# Patient Record
Sex: Male | Born: 1990 | Race: White | Hispanic: No | Marital: Married | Smoking: Never smoker
Health system: Southern US, Community
[De-identification: ages and names within clinical notes are randomized; demographics above are authoritative.]

---
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
## 2015-03-18 ENCOUNTER — Ambulatory Visit
Admission: RE | Admit: 2015-03-18 | Discharge: 2015-03-18 | Disposition: A | Discharge status: Home / Self Care | Payer: No Typology Code available for payment source | Source: Ambulatory Visit | Attending: Occupational Medicine | Admitting: Occupational Medicine

## 2015-03-18 ENCOUNTER — Other Ambulatory Visit: Payer: Self-pay | Admitting: Occupational Medicine

## 2015-03-18 DIAGNOSIS — Z021 Encounter for pre-employment examination: Secondary | ICD-10-CM

## 2016-02-23 ENCOUNTER — Ambulatory Visit
Admission: RE | Admit: 2016-02-23 | Discharge: 2016-02-23 | Disposition: A | Discharge status: Home / Self Care | Payer: Worker's Compensation | Source: Ambulatory Visit | Attending: Nurse Practitioner | Admitting: Nurse Practitioner

## 2016-02-23 ENCOUNTER — Other Ambulatory Visit: Payer: Self-pay | Admitting: Nurse Practitioner

## 2016-02-23 DIAGNOSIS — R52 Pain, unspecified: Secondary | ICD-10-CM

## 2017-07-26 ENCOUNTER — Ambulatory Visit (INDEPENDENT_AMBULATORY_CARE_PROVIDER_SITE_OTHER): Payer: 59

## 2017-07-26 ENCOUNTER — Ambulatory Visit (HOSPITAL_COMMUNITY)
Admission: EM | Admit: 2017-07-26 | Discharge: 2017-07-26 | Disposition: A | Discharge status: Home / Self Care | Payer: 59 | Attending: Family Medicine | Admitting: Family Medicine

## 2017-07-26 ENCOUNTER — Encounter (HOSPITAL_COMMUNITY): Payer: Self-pay | Admitting: Emergency Medicine

## 2017-07-26 DIAGNOSIS — R05 Cough: Secondary | ICD-10-CM

## 2017-07-26 DIAGNOSIS — J019 Acute sinusitis, unspecified: Secondary | ICD-10-CM | POA: Diagnosis not present

## 2017-07-26 DIAGNOSIS — R0602 Shortness of breath: Secondary | ICD-10-CM

## 2017-07-26 DIAGNOSIS — R059 Cough, unspecified: Secondary | ICD-10-CM

## 2017-07-26 MED ORDER — GUAIFENESIN-DM 100-10 MG/5ML PO SYRP: 10.0000 mL | ORAL_SOLUTION | Freq: Four times a day (QID) | ORAL | 0 refills | Status: AC | PRN

## 2017-07-26 MED ORDER — AMOXICILLIN-POT CLAVULANATE 875-125 MG PO TABS: 1.0000 | ORAL_TABLET | Freq: Two times a day (BID) | ORAL | 0 refills | Status: AC

## 2017-07-26 NOTE — ED Provider Notes (Signed)
[facility]    CSN: [id number] Arrival date & time: 07/26/17  1731     History   Chief Complaint Chief Complaint  Patient presents with  . Cough    HPI Thomas Austin is a 27 y.o. male Patient is presenting with URI symptoms- congestion, cough, occasional sore throat. Patient's main complaints are cough. Symptoms have been going on for 2 weeks. Patient has tried Dayquil, with minimal relief. Denies fever, nausea, vomiting, diarrhea. Denies shortness of breath and chest pain. Denies history of asthma, COPD, smoking.    HPI  History reviewed. No pertinent past medical history.  There are no active problems to display for this patient.   History reviewed. No pertinent surgical history.     Home Medications    Prior to Admission medications   Medication Sig Start Date End Date Taking? Authorizing Provider  amoxicillin-clavulanate (AUGMENTIN) 875-125 MG tablet Take 1 tablet by mouth every 12 (twelve) hours for 7 days. 07/26/17 08/02/17  Taria Castrillo C, PA-C  guaiFENesin-dextromethorphan (ROBITUSSIN DM) 100-10 MG/5ML syrup Take 10 mLs by mouth every 6 (six) hours as needed for up to 5 days for cough. 07/26/17 07/31/17  Yaziel Brandon, Junius CreamerHallie C, PA-C    Family History No family history on file.  Social History Social History   Tobacco Use  . Smoking status: Never Smoker  . Smokeless tobacco: Never Used  Substance Use Topics  . Alcohol use: Yes    Comment: rarely  . Drug use: Not on file     Allergies   Patient has no known allergies.   Review of Systems Review of Systems  Constitutional: Negative for activity change, appetite change, fatigue and fever.  HENT: Positive for congestion, postnasal drip and rhinorrhea. Negative for ear pain, sinus pressure and sore throat.   Eyes: Negative for pain and itching.  Respiratory: Positive for cough. Negative for shortness of breath.   Cardiovascular: Negative for chest pain.  Gastrointestinal: Negative for  abdominal pain, diarrhea, nausea and vomiting.  Musculoskeletal: Negative for myalgias.  Skin: Negative for rash.  Neurological: Negative for dizziness, light-headedness and headaches.     Physical Exam Triage Vital Signs ED Triage Vitals  Enc Vitals Group     BP 07/26/17 1758 (!) 157/106     Pulse Rate 07/26/17 1755 98     Resp 07/26/17 1755 16     Temp 07/26/17 1755 99.1 F (37.3 C)     Temp Source 07/26/17 1755 Oral     SpO2 07/26/17 1755 96 %     Weight 07/26/17 1754 280 lb (127 kg)     Height 07/26/17 1754 6\' 2"  (1.88 m)     Head Circumference --      Peak Flow --      Pain Score 07/26/17 1754 4     Pain Loc --      Pain Edu? --      Excl. in GC? --    No data found.  Updated Vital Signs BP (!) 157/106   Pulse 98   Temp 99.1 F (37.3 C) (Oral)   Resp 16   Ht 6\' 2"  (1.88 m)   Wt 280 lb (127 kg)   SpO2 96%   BMI 35.95 kg/m    Physical Exam  Constitutional: He appears well-developed and well-nourished.  HENT:  Head: Normocephalic and atraumatic.  Right Ear: Tympanic membrane and ear canal normal.  Left Ear: Tympanic membrane and ear canal normal.  Nose: Rhinorrhea present.  Mouth/Throat: Uvula is  midline and mucous membranes are normal. No oral lesions. No trismus in the jaw. No uvula swelling. Posterior oropharyngeal erythema present.  Erythematous and swollen turbinates  Eyes: Conjunctivae are normal.  Neck: Neck supple.  Cardiovascular: Normal rate and regular rhythm.  No murmur heard. Pulmonary/Chest: Effort normal and breath sounds normal. No respiratory distress. He has no wheezes. He has no rales.  Clear to auscultation bilaterally no adventitious sounds appreciated  Abdominal: Soft. There is no tenderness.  Musculoskeletal: He exhibits no edema.  Neurological: He is alert.  Skin: Skin is warm and dry.  Psychiatric: He has a normal mood and affect.  Nursing note and vitals reviewed.    UC Treatments / Results  Labs (all labs ordered are  listed, but only abnormal results are displayed) Labs Reviewed - No data to display  EKG  EKG Interpretation None       Radiology Dg Chest 2 View  Result Date: 07/26/2017 CLINICAL DATA:  27 year old male with productive cough for 2 weeks. Shortness of breath and chills. EXAM: CHEST  2 VIEW COMPARISON:  Chest radiographs 06/07/2009. FINDINGS: Mildly lower lung volumes on the PA view, but stable on the lateral view. Mediastinal contours remain normal. Visualized tracheal air column is within normal limits. No pneumothorax, pulmonary edema, pleural effusion or confluent pulmonary opacity. No acute osseous abnormality identified. Negative visible bowel gas pattern. IMPRESSION: Negative.  No acute cardiopulmonary abnormality. Electronically Signed   By: Odessa Fleming M.D.   On: 07/26/2017 18:24    Procedures Procedures (including critical care time)  Medications Ordered in UC Medications - No data to display   Initial Impression / Assessment and Plan / UC Course  I have reviewed the triage vital signs and the nursing notes.  Pertinent labs & imaging results that were available during my care of the patient were reviewed by me and considered in my medical decision making (see chart for details).     Patient presents with symptoms likely from sinusitis versus a post viral cough.  No pneumonia on chest x-ray.  Do not suspect underlying cardiopulmonary process. Symptoms seem unlikely related to ACS, CHF or COPD exacerbations, pneumonia, pneumothorax. Patient is nontoxic appearing and not in need of emergent medical intervention.  We will treat with Augmentin 7 days Robitussin DM for cough advised cough may linger for a couple of weeks. Recommended symptom control with over the counter medications: Daily oral anti-histamine, Oral decongestant or IN corticosteroid, saline irrigations, cepacol lozenges, Robitussin, Delsym, honey tea.  Return if symptoms fail to improve in 1-2 weeks or you develop  shortness of breath, chest pain, severe headache. Patient states understanding and is agreeable.     Final Clinical Impressions(s) / UC Diagnoses   Final diagnoses:  Cough  Acute sinusitis with symptoms > 10 days    ED Discharge Orders        Ordered    amoxicillin-clavulanate (AUGMENTIN) 875-125 MG tablet  Every 12 hours     07/26/17 1831    guaiFENesin-dextromethorphan (ROBITUSSIN DM) 100-10 MG/5ML syrup  Every 6 hours PRN     07/26/17 1831       Controlled Substance Prescriptions [place] Controlled Substance Registry consulted? Not Applicable   Lew Dawes, [place] 07/26/17 2104

## 2017-07-26 NOTE — ED Triage Notes (Signed)
PT reports sinus symptoms 2 weeks ago that turned into a productive cough with cold chills.

## 2017-07-26 NOTE — Discharge Instructions (Signed)
Please take augmentin twice daily for 7 days. We are treating you for a sinus infection.   Please also take daily zyrtec or claritin (store brand okay) consistently for 2 week. May also use flonase. These are found over the counter.  I have sent in Robitussin DM for cough, use as needed. May also try honey tea for cough.  Please return if symptoms persisting without improvement in 1-2 weeks.

## 2018-10-21 IMAGING — DX DG CHEST 2V
2 series · 2 of 2 positions shown · non-contrast
Comparison: Chest radiographs 06/07/2009.

CLINICAL DATA: 26-year-old male with productive cough for 2 weeks.
Shortness of breath and chills.

EXAM:
CHEST  2 VIEW

[chest pa]
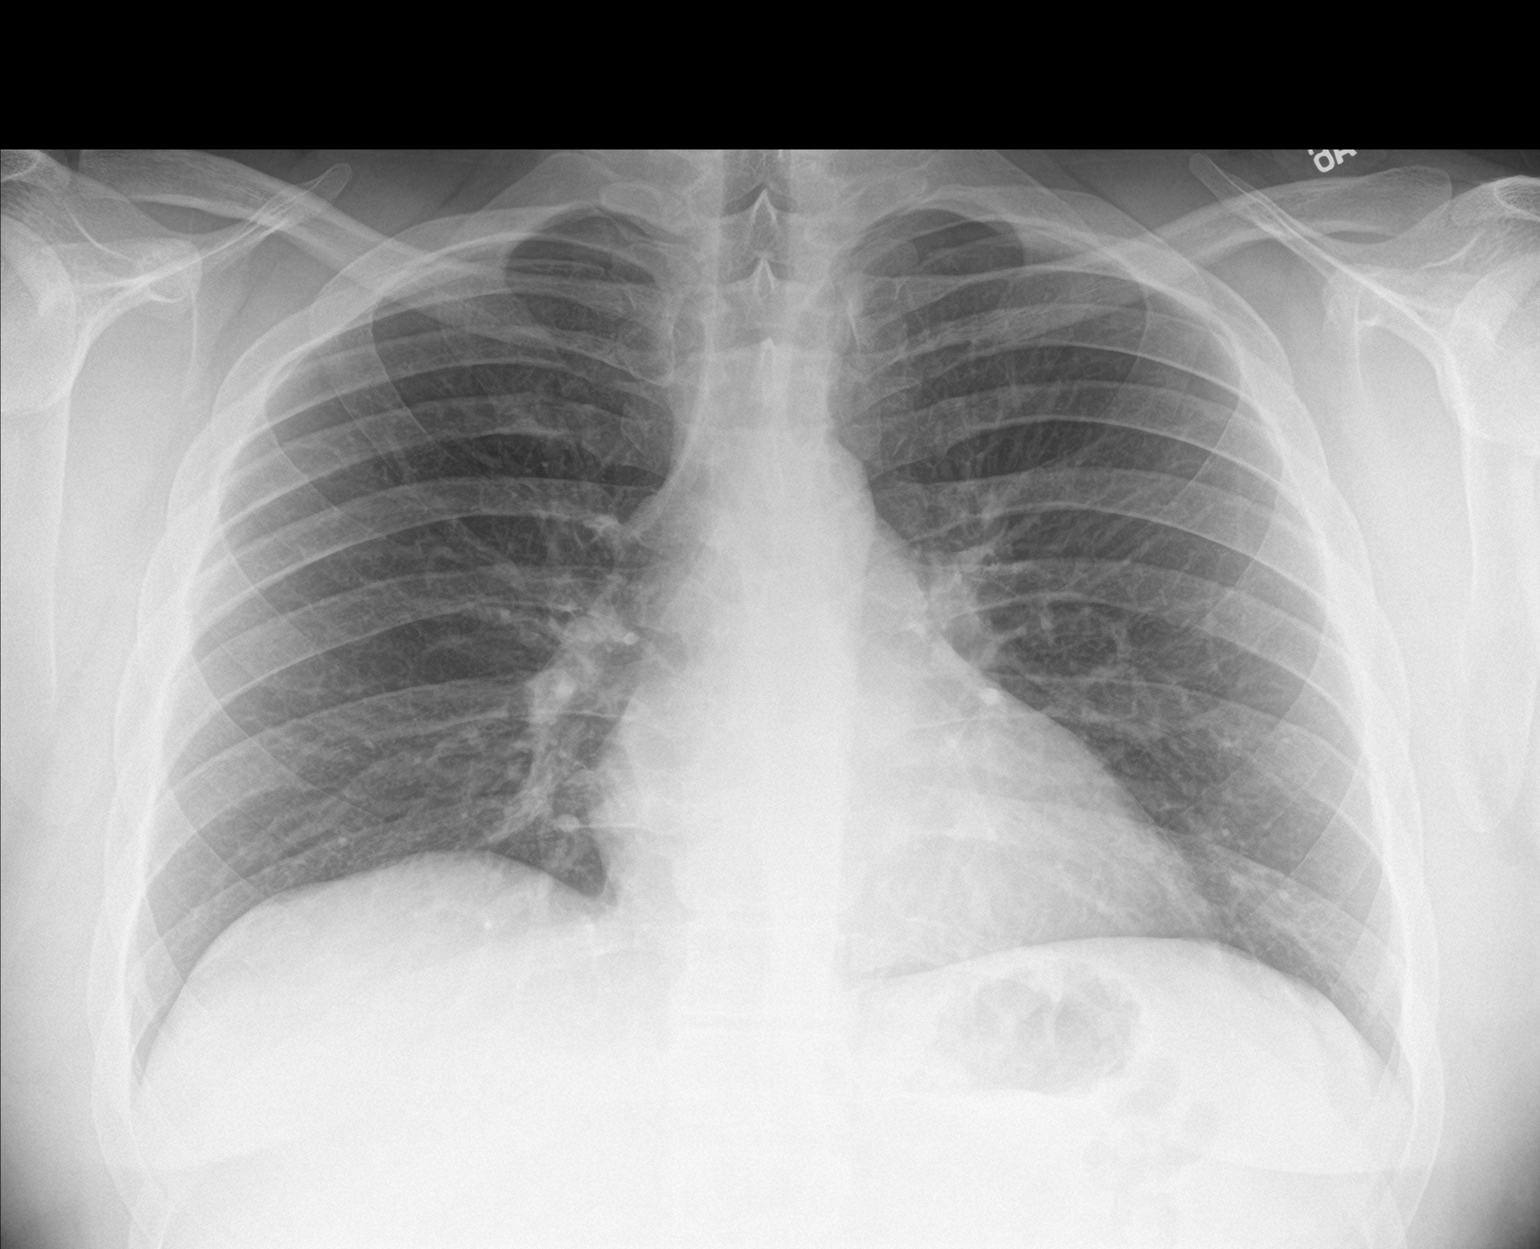

[chest lat]
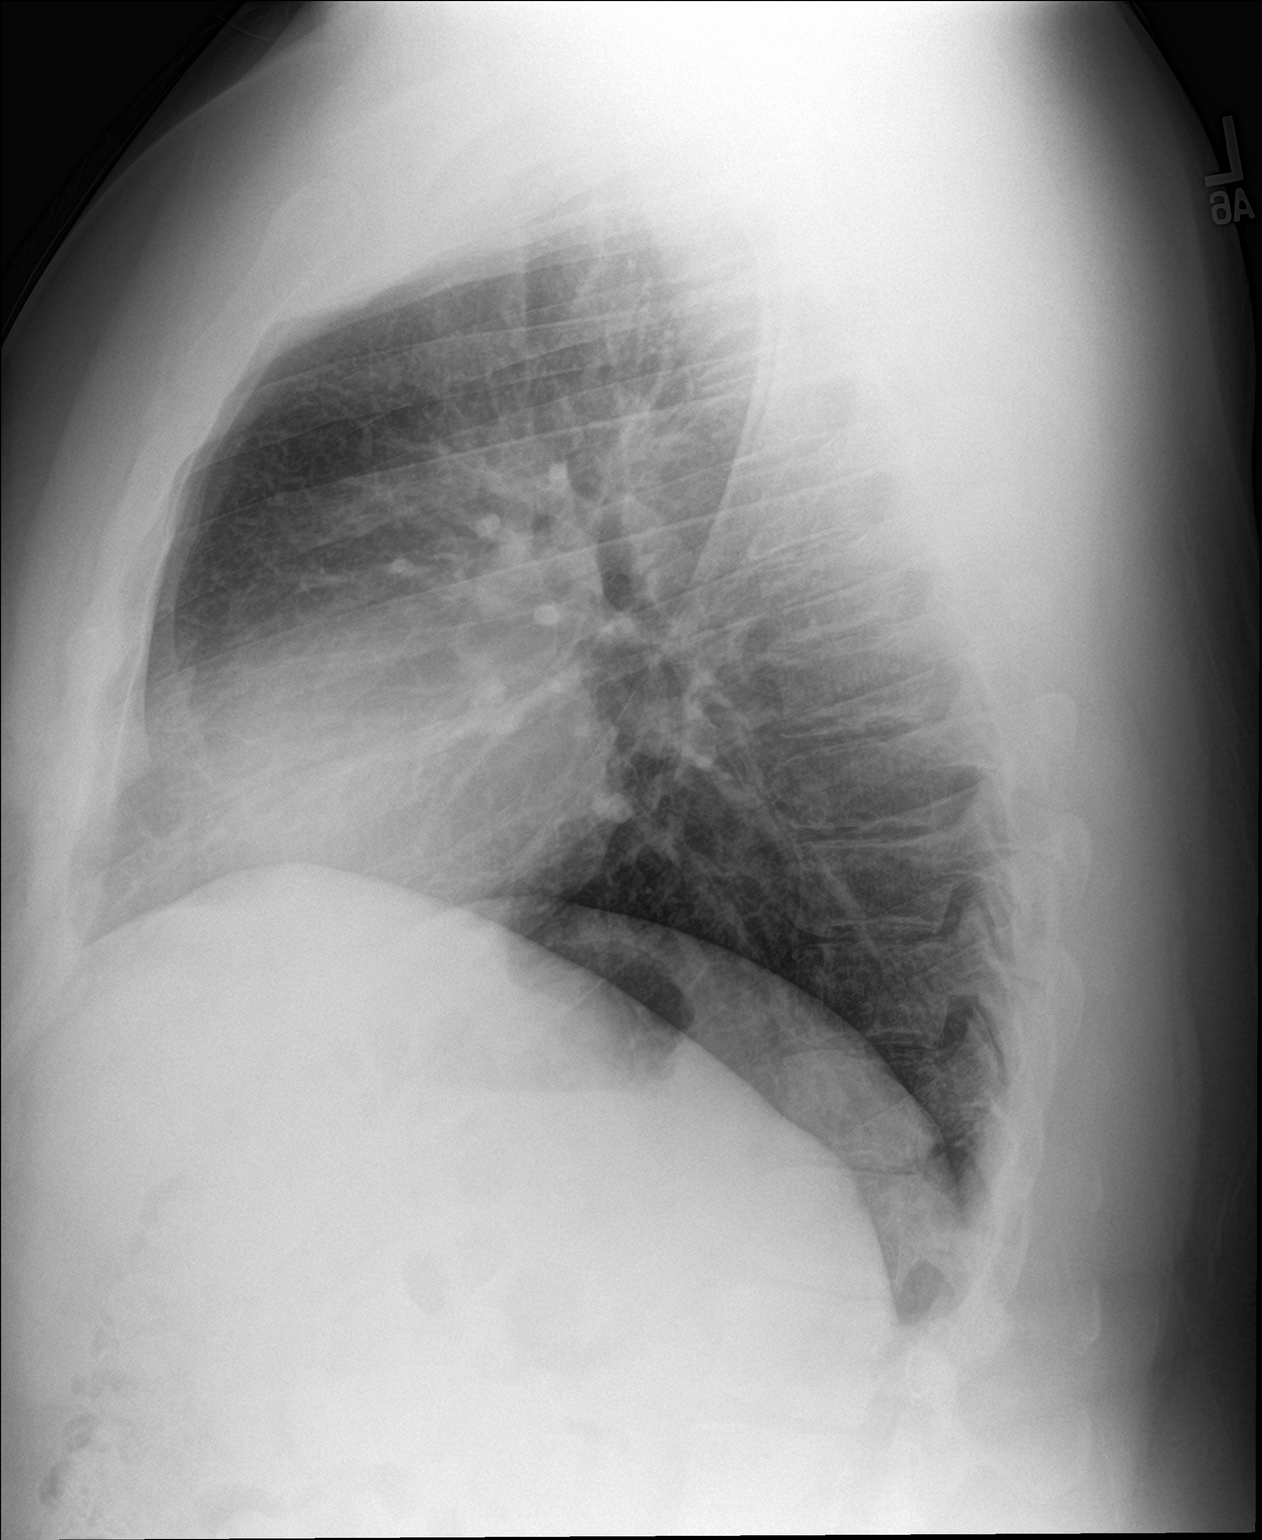

[2 of 2 positions shown; findings below may reference images not displayed]

FINDINGS: Mildly lower lung volumes on the PA view, but stable on the lateral
view. Mediastinal contours remain normal. Visualized tracheal air
column is within normal limits. No pneumothorax, pulmonary edema,
pleural effusion or confluent pulmonary opacity. No acute osseous
abnormality identified. Negative visible bowel gas pattern.
IMPRESSION: Negative.  No acute cardiopulmonary abnormality.

## 2019-02-19 ENCOUNTER — Ambulatory Visit (HOSPITAL_COMMUNITY)
Admission: EM | Admit: 2019-02-19 | Discharge: 2019-02-19 | Discharge status: Against Medical Advice | Payer: 59 | Attending: Family Medicine | Admitting: Family Medicine

## 2019-02-19 ENCOUNTER — Other Ambulatory Visit: Payer: Self-pay
